# Patient Record
Sex: Female | Born: 2005 | Hispanic: Yes | Marital: Single | Smoking: Never smoker
Health system: Southern US, Community
[De-identification: ages and names within clinical notes are randomized; demographics above are authoritative.]

## PROBLEM LIST (undated history)

## (undated) DIAGNOSIS — N946 Dysmenorrhea, unspecified: Secondary | ICD-10-CM

## (undated) HISTORY — DX: Dysmenorrhea, unspecified: N94.6

---
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
## 2024-04-06 ENCOUNTER — Other Ambulatory Visit: Payer: Self-pay | Admitting: Certified Nurse Midwife

## 2024-04-06 DIAGNOSIS — N63 Unspecified lump in unspecified breast: Secondary | ICD-10-CM

## 2024-04-06 DIAGNOSIS — R928 Other abnormal and inconclusive findings on diagnostic imaging of breast: Secondary | ICD-10-CM

## 2024-05-08 ENCOUNTER — Ambulatory Visit
Admission: RE | Admit: 2024-05-08 | Discharge: 2024-05-08 | Disposition: A | Source: Ambulatory Visit | Attending: Certified Nurse Midwife | Admitting: Certified Nurse Midwife

## 2024-05-08 DIAGNOSIS — R928 Other abnormal and inconclusive findings on diagnostic imaging of breast: Secondary | ICD-10-CM | POA: Diagnosis present

## 2024-05-08 DIAGNOSIS — N63 Unspecified lump in unspecified breast: Secondary | ICD-10-CM | POA: Diagnosis present
